# Patient Record
Sex: Female | Born: 1990 | Race: White | Hispanic: No | Marital: Married | Smoking: Never smoker
Health system: Southern US, Community
[De-identification: ages and names within clinical notes are randomized; demographics above are authoritative.]

## PROBLEM LIST (undated history)

## (undated) DIAGNOSIS — Z789 Other specified health status: Secondary | ICD-10-CM

## (undated) HISTORY — PX: NO PAST SURGERIES: SHX2092

---

## 2008-01-10 DIAGNOSIS — N915 Oligomenorrhea, unspecified: Secondary | ICD-10-CM | POA: Insufficient documentation
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
## 2016-09-28 ENCOUNTER — Encounter: Payer: Self-pay | Admitting: Family Medicine

## 2016-09-28 ENCOUNTER — Ambulatory Visit (INDEPENDENT_AMBULATORY_CARE_PROVIDER_SITE_OTHER): Payer: BC Managed Care – PPO | Admitting: Family Medicine

## 2016-09-28 ENCOUNTER — Ambulatory Visit: Payer: Self-pay

## 2016-09-28 VITALS — BP 108/78 | HR 62 | Ht 65.0 in | Wt 126.0 lb

## 2016-09-28 DIAGNOSIS — M25562 Pain in left knee: Secondary | ICD-10-CM

## 2016-09-28 DIAGNOSIS — G8929 Other chronic pain: Secondary | ICD-10-CM | POA: Insufficient documentation

## 2016-09-28 DIAGNOSIS — M25569 Pain in unspecified knee: Secondary | ICD-10-CM

## 2016-09-28 MED ORDER — DICLOFENAC SODIUM 2 % TD SOLN: 2.0000 "application " | Freq: Two times a day (BID) | TRANSDERMAL | 3 refills | Status: DC

## 2016-09-28 MED ORDER — VITAMIN D (ERGOCALCIFEROL) 1.25 MG (50000 UNIT) PO CAPS: 50000.0000 [IU] | ORAL_CAPSULE | ORAL | 0 refills | Status: DC

## 2016-09-28 NOTE — Patient Instructions (Addendum)
Good to see you.  Ice 20 minutes 2 times daily. Usually after activity and before bed. Exercises 3 times a week.  Avoid deep squats or twisting motions. Or any jumping.   cross train with biking and elliptical.  pennsaid pinkie amount topically 2 times daily as needed.   Once weekly vitamin D for next 12 weeks to help if there is a small fracture.  See me again in 4 weeks Happy holidays!

## 2016-09-28 NOTE — Assessment & Plan Note (Signed)
I believe the patient is having some patellofemoral pain is likely secondary to more of a syndrome. There is some mild increase in Doppler flow. Patient does state that there is some swelling. Concern for potential OCD. We discussed we could get another x-ray but patient declined. Patient will given home exercises and will try this. We discussed topical anti-inflammatories and once weekly vitamin D. This will help with muscle strength and endurance. Follow-up again in 4 weeks.

## 2016-10-26 ENCOUNTER — Ambulatory Visit: Payer: BC Managed Care – PPO | Admitting: Family Medicine
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
## 2017-02-06 ENCOUNTER — Ambulatory Visit (INDEPENDENT_AMBULATORY_CARE_PROVIDER_SITE_OTHER)
Admission: RE | Admit: 2017-02-06 | Discharge: 2017-02-06 | Disposition: A | Discharge status: Home / Self Care | Payer: BC Managed Care – PPO | Source: Ambulatory Visit | Attending: Family Medicine | Admitting: Family Medicine

## 2017-02-06 ENCOUNTER — Encounter: Payer: Self-pay | Admitting: Family Medicine

## 2017-02-06 ENCOUNTER — Ambulatory Visit: Payer: BC Managed Care – PPO | Admitting: Family Medicine

## 2017-02-06 ENCOUNTER — Ambulatory Visit (INDEPENDENT_AMBULATORY_CARE_PROVIDER_SITE_OTHER): Payer: BC Managed Care – PPO | Admitting: Family Medicine

## 2017-02-06 DIAGNOSIS — G8929 Other chronic pain: Secondary | ICD-10-CM

## 2017-02-06 DIAGNOSIS — M25562 Pain in left knee: Secondary | ICD-10-CM

## 2017-02-06 NOTE — Progress Notes (Signed)
Pre-visit discussion using our clinic review tool. No additional management support is needed unless otherwise documented below in the visit note.  

## 2017-02-06 NOTE — Patient Instructions (Signed)
Godo to see you Continue the exercises pennsaid pinkie amount topically 2 times daily as needed.  Try the brace with working out and a lot of activity  PT will be calling you   Xray downstairs  See em again in 3-6 weeks and if not better we will consider MRI.

## 2017-02-06 NOTE — Assessment & Plan Note (Signed)
I believe the patient does have more of a chronic subluxation of the kneecap. X-rays ordered today, sent to formal physical therapy, continue topical anti-inflammatories, we discussed icing regimen. Patient will continue to be active. Follow-up again in 4 weeks. Brace given today.

## 2017-02-07 ENCOUNTER — Ambulatory Visit: Payer: BC Managed Care – PPO | Attending: Family Medicine

## 2017-02-07 DIAGNOSIS — M25562 Pain in left knee: Secondary | ICD-10-CM | POA: Diagnosis present

## 2017-02-07 DIAGNOSIS — M228X2 Other disorders of patella, left knee: Secondary | ICD-10-CM

## 2017-02-07 DIAGNOSIS — G8929 Other chronic pain: Secondary | ICD-10-CM

## 2017-02-07 DIAGNOSIS — M6281 Muscle weakness (generalized): Secondary | ICD-10-CM
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
## 2017-02-07 NOTE — Progress Notes (Signed)
Spoke with patient letting her know that her x-ray is normal.

## 2017-03-08 ENCOUNTER — Ambulatory Visit (INDEPENDENT_AMBULATORY_CARE_PROVIDER_SITE_OTHER): Payer: BC Managed Care – PPO | Admitting: Family Medicine

## 2017-03-08 ENCOUNTER — Encounter: Payer: Self-pay | Admitting: Family Medicine

## 2017-03-08 DIAGNOSIS — G8929 Other chronic pain: Secondary | ICD-10-CM | POA: Diagnosis not present

## 2017-03-08 DIAGNOSIS — M25562 Pain in left knee: Secondary | ICD-10-CM | POA: Diagnosis not present

## 2017-03-08 NOTE — Assessment & Plan Note (Signed)
Patient given injection of left knee today due to no improvement and if anything worsening symptoms. Hopefully that this will be beneficial. Patient is failed all other conservative therapy. Continued have pain we will get an MRI for further evaluation make sure patient does not have a buckle handle tear or potential OCD. Patient has very minimal swelling today so I do not think these are very likely. Patient will come back and see me otherwise in 6 weeks.

## 2017-03-08 NOTE — Patient Instructions (Signed)
Good to see you  Ice is your friend Try the brace with a lot of activity  Send me a message in 2 weeks and tell me how you are doing.  See me again otherwise in 4-6 weeks.
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
## 2017-03-10 ENCOUNTER — Telehealth: Payer: Self-pay | Admitting: General Practice

## 2017-03-10 NOTE — Telephone Encounter (Signed)
Pt husband called about his wifes Vitamin D, Ergocalciferol, (DRISDOL) 50000 units CAPS capsule  Would like to know if a refill is appropriate   [facility] on [address]

## 2017-03-13 ENCOUNTER — Other Ambulatory Visit: Payer: Self-pay

## 2017-03-13 MED ORDER — VITAMIN D (ERGOCALCIFEROL) 1.25 MG (50000 UNIT) PO CAPS: 50000.0000 [IU] | ORAL_CAPSULE | ORAL | 0 refills | Status: DC

## 2017-03-13 NOTE — Telephone Encounter (Signed)
Dr. Katrinka BlazingSmith recommends that she try Vitamin D again. Rx sent in per his verbal recommendation.

## 2017-03-22 ENCOUNTER — Encounter: Payer: Self-pay | Admitting: Family Medicine

## 2017-03-22 DIAGNOSIS — M25562 Pain in left knee: Principal | ICD-10-CM

## 2017-03-22 DIAGNOSIS — G8929 Other chronic pain: Secondary | ICD-10-CM

## 2017-04-05 ENCOUNTER — Ambulatory Visit
Admission: RE | Admit: 2017-04-05 | Discharge: 2017-04-05 | Disposition: A | Discharge status: Home / Self Care | Payer: BC Managed Care – PPO | Source: Ambulatory Visit | Attending: Family Medicine | Admitting: Family Medicine

## 2017-04-05 DIAGNOSIS — M25562 Pain in left knee: Principal | ICD-10-CM

## 2017-04-05 DIAGNOSIS — G8929 Other chronic pain: Secondary | ICD-10-CM

## 2017-04-06 ENCOUNTER — Encounter: Payer: Self-pay | Admitting: Family Medicine

## 2017-06-03 ENCOUNTER — Other Ambulatory Visit: Payer: Self-pay | Admitting: Family Medicine

## 2017-06-05 NOTE — Telephone Encounter (Signed)
Refill done.  

## 2017-07-14 LAB — OB RESULTS CONSOLE GC/CHLAMYDIA
Chlamydia: NEGATIVE
Gonorrhea: NEGATIVE

## 2017-07-14 LAB — OB RESULTS CONSOLE RUBELLA ANTIBODY, IGM: Rubella: IMMUNE

## 2017-07-14 LAB — OB RESULTS CONSOLE HIV ANTIBODY (ROUTINE TESTING): HIV: NONREACTIVE

## 2017-07-14 LAB — OB RESULTS CONSOLE RPR: RPR: NONREACTIVE

## 2017-07-14 LAB — OB RESULTS CONSOLE HEPATITIS B SURFACE ANTIGEN: HEP B S AG: NEGATIVE
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
## 2018-01-12 LAB — OB RESULTS CONSOLE GBS: GBS: NEGATIVE

## 2018-02-07 ENCOUNTER — Inpatient Hospital Stay (HOSPITAL_COMMUNITY): Payer: BC Managed Care – PPO | Admitting: Anesthesiology

## 2018-02-07 ENCOUNTER — Encounter (HOSPITAL_COMMUNITY): Payer: Self-pay

## 2018-02-07 ENCOUNTER — Inpatient Hospital Stay (HOSPITAL_COMMUNITY)
Admission: AD | Admit: 2018-02-07 | Discharge: 2018-02-11 | DRG: 787 | Disposition: A | Discharge status: Home / Self Care | Payer: BC Managed Care – PPO | Source: Ambulatory Visit | Attending: Obstetrics and Gynecology | Admitting: Obstetrics and Gynecology

## 2018-02-07 ENCOUNTER — Encounter (HOSPITAL_COMMUNITY)
Admission: AD | Disposition: A | Discharge status: Home / Self Care | Payer: Self-pay | Source: Ambulatory Visit | Attending: Obstetrics and Gynecology

## 2018-02-07 DIAGNOSIS — O9081 Anemia of the puerperium: Secondary | ICD-10-CM | POA: Diagnosis not present

## 2018-02-07 DIAGNOSIS — Z3A40 40 weeks gestation of pregnancy: Secondary | ICD-10-CM

## 2018-02-07 DIAGNOSIS — D62 Acute posthemorrhagic anemia: Secondary | ICD-10-CM | POA: Diagnosis not present

## 2018-02-07 DIAGNOSIS — Z3483 Encounter for supervision of other normal pregnancy, third trimester: Secondary | ICD-10-CM | POA: Diagnosis present

## 2018-02-07 DIAGNOSIS — O3663X0 Maternal care for excessive fetal growth, third trimester, not applicable or unspecified: Principal | ICD-10-CM | POA: Diagnosis present

## 2018-02-07 HISTORY — DX: Other specified health status: Z78.9

## 2018-02-07 LAB — CBC
HCT: 33.2 % — ABNORMAL LOW (ref 36.0–46.0)
HEMOGLOBIN: 11.2 g/dL — AB (ref 12.0–15.0)
MCH: 29.7 pg (ref 26.0–34.0)
MCHC: 33.7 g/dL (ref 30.0–36.0)
MCV: 88.1 fL (ref 78.0–100.0)
Platelets: 152 10*3/uL (ref 150–400)
RBC: 3.77 MIL/uL — AB (ref 3.87–5.11)
RDW: 13.5 % (ref 11.5–15.5)
WBC: 15 10*3/uL — AB (ref 4.0–10.5)

## 2018-02-07 LAB — TYPE AND SCREEN
ABO/RH(D): O POS
ANTIBODY SCREEN: NEGATIVE

## 2018-02-07 LAB — ABO/RH: ABO/RH(D): O POS

## 2018-02-07 SURGERY — Surgical Case
Anesthesia: Regional

## 2018-02-07 MED ORDER — PHENYLEPHRINE 40 MCG/ML (10ML) SYRINGE FOR IV PUSH (FOR BLOOD PRESSURE SUPPORT)
80.0000 ug | PREFILLED_SYRINGE | INTRAVENOUS | Status: DC | PRN
  Administered 2018-02-07: 80 ug via INTRAVENOUS

## 2018-02-07 MED ORDER — FENTANYL 2.5 MCG/ML BUPIVACAINE 1/10 % EPIDURAL INFUSION (WH - ANES)
14.0000 mL/h | INTRAMUSCULAR | Status: DC | PRN
  Administered 2018-02-07 (×2): 14 mL/h via EPIDURAL
  Filled 2018-02-07 (×2): qty 100

## 2018-02-07 MED ORDER — LIDOCAINE HCL (PF) 1 % IJ SOLN
INTRAMUSCULAR | Status: DC | PRN
  Administered 2018-02-07: 8 mL via EPIDURAL

## 2018-02-07 MED ORDER — EPHEDRINE 5 MG/ML INJ: 10.0000 mg | INTRAVENOUS | Status: DC | PRN

## 2018-02-07 MED ORDER — OXYTOCIN 40 UNITS IN LACTATED RINGERS INFUSION - SIMPLE MED
2.5000 [IU]/h | INTRAVENOUS | Status: DC
  Filled 2018-02-07: qty 1000

## 2018-02-07 MED ORDER — OXYTOCIN BOLUS FROM INFUSION: 500.0000 mL | Freq: Once | INTRAVENOUS | Status: DC

## 2018-02-07 MED ORDER — LACTATED RINGERS IV SOLN
INTRAVENOUS | Status: DC
  Administered 2018-02-07 – 2018-02-08 (×4): via INTRAVENOUS

## 2018-02-07 MED ORDER — LACTATED RINGERS IV SOLN
500.0000 mL | INTRAVENOUS | Status: DC | PRN
  Administered 2018-02-07 (×2): 500 mL via INTRAVENOUS

## 2018-02-07 MED ORDER — NALBUPHINE HCL 10 MG/ML IJ SOLN
10.0000 mg | INTRAMUSCULAR | Status: AC
  Administered 2018-02-07: 10 mg via SUBCUTANEOUS
  Filled 2018-02-07: qty 1

## 2018-02-07 MED ORDER — ONDANSETRON HCL 4 MG/2ML IJ SOLN
4.0000 mg | Freq: Four times a day (QID) | INTRAMUSCULAR | Status: DC | PRN
  Administered 2018-02-07 (×2): 4 mg via INTRAVENOUS
  Filled 2018-02-07 (×2): qty 2

## 2018-02-07 MED ORDER — OXYTOCIN 40 UNITS IN LACTATED RINGERS INFUSION - SIMPLE MED
1.0000 m[IU]/min | INTRAVENOUS | Status: DC
  Administered 2018-02-07: 2 m[IU]/min via INTRAVENOUS

## 2018-02-07 MED ORDER — TERBUTALINE SULFATE 1 MG/ML IJ SOLN: 0.2500 mg | Freq: Once | INTRAMUSCULAR | Status: DC | PRN

## 2018-02-07 MED ORDER — DIPHENHYDRAMINE HCL 50 MG/ML IJ SOLN
12.5000 mg | INTRAMUSCULAR | Status: DC | PRN
Start: 2018-02-07 — End: 2018-02-08

## 2018-02-07 MED ORDER — PHENYLEPHRINE 40 MCG/ML (10ML) SYRINGE FOR IV PUSH (FOR BLOOD PRESSURE SUPPORT)
80.0000 ug | PREFILLED_SYRINGE | INTRAVENOUS | Status: DC | PRN
  Filled 2018-02-07: qty 10

## 2018-02-07 MED ORDER — LIDOCAINE HCL (PF) 1 % IJ SOLN
30.0000 mL | INTRAMUSCULAR | Status: DC | PRN
  Filled 2018-02-07: qty 30

## 2018-02-07 MED ORDER — LACTATED RINGERS IV SOLN
500.0000 mL | Freq: Once | INTRAVENOUS | Status: AC
  Administered 2018-02-07: 500 mL via INTRAVENOUS

## 2018-02-07 MED ORDER — SOD CITRATE-CITRIC ACID 500-334 MG/5ML PO SOLN
30.0000 mL | ORAL | Status: DC | PRN
  Administered 2018-02-07: 30 mL via ORAL
  Filled 2018-02-07: qty 15

## 2018-02-07 MED ORDER — ACETAMINOPHEN 325 MG PO TABS: 650.0000 mg | ORAL_TABLET | ORAL | Status: DC | PRN

## 2018-02-07 NOTE — MAU Note (Signed)
CTX 5 mins apart.  No LOF.  Some light spotting tonight.  Reports good FM.  No complications with the pregnancy.

## 2018-02-07 NOTE — Anesthesia Preprocedure Evaluation (Signed)

## 2018-02-07 NOTE — Progress Notes (Signed)
S:  Pt. Breathing well through contractions; has been in the shower/using birth ball and exaggerated sims position with peanut. Asked for membrane sweep to help progress contractions.   O:  VS: Blood pressure (!) 98/45, pulse 70, temperature 98.1 F (36.7 C), resp. rate 20, height 5\' 6"  (1.676 m), weight 75 kg (165 lb 4 oz).        FHR : baseline 145bpm / variability moderate / accelerations + / no decelerations        Toco: contractions every 3-6 minutes / moderate        Cervix : Dilation: 4 Effacement (%): 90 Station: 0 Presentation: Vertex Exam by:: M.Mikael Skoda, CNM  Membrane sweep performed        Membranes: Intact  A: Latent labor     FHR category 1 with intermittent monitoring     GBS Negative     Suspect LGA  P: Pt. Desires low intervention birth; desires to rest.  Encouraged exaggerated sims with peanut. Continuous labor support from spouse, mom, and CNM.     Carlean JewsMeredith Keturah Yerby, MSN, CNM [facility]

## 2018-02-07 NOTE — Progress Notes (Signed)
S:  Resting in bed with peanut after shower       Breathing with ctx - states difficulty relaxing with ctx  O:  VS: BP (!) 98/45   Pulse 70   Temp 98.1 F (36.7 C)   Resp 20   Ht 5\' 6"  (1.676 m)   Wt 75 kg (165 lb 4 oz)   BMI 26.67 kg/m         FHR : 145 intermittent        Toco: contractions every 3 minutes        Cervix : last exam by Carlean JewsMeredith Sigmon CNM ~ 30 minutes ago / cervix same / membranes swept        Membranes: intact / + show  A: latent labor     LGA     desires limited intervention  P: offered Nubain dose to help relaxation without sedation - agrees      expectant management    Marlinda Mikeanya Kamee Bobst CNM, MSN, [facility] 02/07/2018, 9:12 AM

## 2018-02-07 NOTE — Progress Notes (Signed)
S:  Reports contractions as more painful.  Unable to relax with contractions.  Discussed risks and benefits of nitrous oxide and  Epidural.  Desires epidural and AROM.   Family is present and supportive.    O:   Vitals: BP (!) 96/56   Pulse 63   Temp 98.3 F (36.8 C) (Oral)   Resp 20   Ht 5\' 6"  (1.676 m)   Wt 75 kg (165 lb 4 oz)   BMI 26.67 kg/m  Contractions every 3-5 minutes FHR 120 bpm, moderate variability, 15x15 accels, no decels Bloody show, AROM, blood tinged. SVE Dilation: 4.5 Effacement (%): 90 Exam by:: S.Briell Paulette, midwife student   A: Protracted Latent labor Suspect LGA   P: AROM Epidural Continuous monitoring Provide emotional support. Anticipate NSVD.    Ceasar MonsSarah Averill Pons, RN, SNM 02/07/2018 , 12:49 PM

## 2018-02-07 NOTE — Progress Notes (Signed)
S:  Patient comfortable with epidural.  Denies feeling vaginal or rectal pressure.  Lying left lateral with peanut ball.  Has been able to get some rest.  Family is present and supportive.  O:   Vitals: BP (!) 96/57   Pulse (!) 58   Temp 98 F (36.7 C) (Oral)   Resp 18   Ht 5\' 6"  (1.676 m)   Wt 75 kg (165 lb 4 oz)   SpO2 94%   BMI 26.67 kg/m   Urine dark yellow, concentrated Contractions every 5-6 minutes, lasting 120-140 seconds. FHR 120 bpm, moderate variability, 15x15 accels , no decels Bloody show, AROM. SVE Dilation: 7 Effacement (%): 100 Exam by:: Young BerryS Omarrion Carmer midwife student  Fetal Caput present  A: Active labor Category 1 FHR Suspect LGA Concentrated urine  P: Continue with current plan of care, expectant management. 500 ml bolus, encourage to hydrate Continuous Monitoring Provide emotional support. Anticipate NSVD.   Ceasar MonsSarah Rolonda Pontarelli, RN, SNM 02/07/2018 , 4:16 PM

## 2018-02-07 NOTE — Progress Notes (Signed)
S:  pushed x 1.5 hour - no pain or pressure  O:  VS: BP (!) 113/56   Pulse 66   Temp 98.5 F (36.9 C) (Oral)   Resp 16   Ht 5\' 6"  (1.676 m)   Wt 75 kg (165 lb 4 oz)   SpO2 94%   BMI 26.67 kg/m         FHR : baseline 145 / variability moderate / accelerations absent / variable decelerations        Toco: contractions every 2-6 minutes / moderate         vtx +1 caput - no descent  A: arrest of descent in second stage of labor     FHR category 2  P: consult for CS delivery   Discussed arrest of descent - needs for operative delivery  / risk of surgery include infection, excess bleeding, need for transfusion and risks associated with transfusion, unintentional damage to local organs specifically bladder and bowel, extended recovery time post-op,  Additionally,  risk to not having surgery is inability to achieve vaginal delivery and fetal stress with continued pushing efforts without adequate pelvic space  Agrees to surgery option at this time - Dr Billy Coastaavon en route for c-section delivery     Marlinda Mikeanya Bailey CNM, MSN, [facility]NM 02/07/2018, 11:54 PM

## 2018-02-07 NOTE — Anesthesia Procedure Notes (Signed)
Epidural Patient location during procedure: OB Start time: 02/07/2018 1:40 PM End time: 02/07/2018 1:45 PM  Staffing Anesthesiologist: Bethena Midgetddono, Aurorah Schlachter, MD  Preanesthetic Checklist Completed: patient identified, site marked, surgical consent, pre-op evaluation, timeout performed, IV checked, risks and benefits discussed and monitors and equipment checked  Epidural Patient position: sitting Prep: site prepped and draped and DuraPrep Patient monitoring: continuous pulse ox and blood pressure Approach: midline Injection technique: LOR air  Needle:  Needle type: Tuohy  Needle gauge: 17 G Needle length: 9 cm and 9 Needle insertion depth: 6 cm Catheter type: closed end flexible Catheter size: 19 Gauge Catheter at skin depth: 11 cm Test dose: negative  Assessment Events: blood not aspirated, injection not painful, no injection resistance, negative IV test and no paresthesia

## 2018-02-07 NOTE — Plan of Care (Signed)
Pt desires low intervention unmedicated labor and delivery.  Options for nonpharmacological pain control such as position changes, shower, walking, and birthing ball explained to pt.  Pt has no IV per her request and CNM is aware; explained to pt that IV would need to be obtained in the case of an emergency.  Currently being monitored intermittently per CNM order. Will continue to monitor and assist pt as needed, will communicate pertinent information to team.

## 2018-02-07 NOTE — Progress Notes (Signed)
S:  no pressure or urge to push - dense epidural  O:  VS: BP 122/74   Pulse 73   Temp 98.2 F (36.8 C) (Oral)   Resp 16   Ht 5\' 6"  (1.676 m)   Wt 75 kg (165 lb 4 oz)   SpO2 94%   BMI 26.67 kg/m         FHR : baseline 130 / variability moderate / accelerations + / variable decelerations        Toco: contractions every 2-6 minutes / moderate         Complete at 1830 @ 0 station with history of protracted labor / ROT        Cervix : 10cm / 100% / vtx +1 with small caput        Membranes: clear with small show        Foley with clear urine  A: active labor     FHR category 2  P: pitocin augmentation to improve strength and frequency of ctx - recheck 1 hour      If no progressive descent consult MD  Marlinda Mikeanya Leopold Smyers CNM, MSN, [facility] 02/07/2018, 9:19 PM

## 2018-02-07 NOTE — H&P (Addendum)
  OB ADMISSION/ HISTORY & PHYSICAL:  Admission Date: 02/07/2018  5:36 AM  Admit Diagnosis: Labor at 40 weeks   Robin Mcneil is a 27 y.o. female G1P0 at 40 weeks presenting for labor.  She reports contractions started around 6:00pm yesterday, now stronger and closer together.   Prenatal History: G1P0   EDC : 02/07/2018, by Other B[facility]  [facility]  Primary Ob Provider: Marlinda Mikeanya Bailey  Prenatal course complicated by  - Isolated EIF on anatomy sono, no further f/u  - Mild Maternal Anemia  - LGA at 32 weeks - Excessive weight gain of 42 pounds   Prenatal Labs: ABO, Rh:  O Positive  Antibody:  Negative Rubella:   Immune RPR:   NR HBsAg:   Negative HIV:   Negative  GTT: 100 GBS:   GBS Negative Quad screen Negative CUS: normal anatomy, female, anterior placenta, isolated EIF 32 week sono for size/dates discrepancy: LGA @ 95% Flu and Tdap UTD  Medical / Surgical History :  Past medical history: History reviewed. No pertinent past medical history.   Past surgical history: History reviewed. No pertinent surgical history.  Family History: No family history on file.   Social History:  reports that she has never smoked. She has never used smokeless tobacco. Her alcohol and drug histories are not on file.   Allergies: Patient has no known allergies.    Current Medications at time of admission:  Prior to Admission medications   Medication Sig Start Date End Date Taking? Authorizing Provider  Diclofenac Sodium (PENNSAID) 2 % SOLN Place 2 application onto the skin 2 (two) times daily. 09/28/16   Judi SaaSmith, Zachary M, DO  Vitamin D, Ergocalciferol, (DRISDOL) 50000 units CAPS capsule TAKE 1 CAPSULE BY MOUTH ONCE EVERY 7 DAYS 06/05/17   Judi SaaSmith, Zachary M, DO     Review of Systems: Active FM onset of ctx @ 6pm currently every 5-6 minutes No LOF  / SROM  bloody show present   Physical Exam:  VS: Blood pressure 120/67, pulse 83, temperature 98.1 F  (36.7 C), resp. rate 20, height 5\' 6"  (1.676 m), weight 75 kg (165 lb 4 oz).  General: alert and oriented, grimacing through contractions  Heart: RRR Lungs: Clear lung fields Abdomen: Gravid, soft and non-tender, non-distended / uterus: gravid, non-tender Extremities: no edema  Genitalia / VE: Dilation: 4 Effacement (%): 90 Station: -1, 0 Exam by:: Delta Air LinesMeredith Sigmon, CNM  FHR: baseline rate 135 bpm/ variability moderate / accelerations +15x15 / no decelerations TOCO: every 5-6 minutes  Assessment: [redacted] weeks gestation First stage of labor, latent FHR category 1 GBS Negative  Suspect LGA  Plan:  [facility]   - Routine labor and delivery orders   - Pt. Declines IV at this time   - Pain management: none at this time; open to nitrous    - Encouraged frequent position changes, birth ball, shower  2. GBS Negative   - No prophylaxis indicated 3. Postpartum:   - Breast   - Circumcision: undecided  4. Anticipate MOD: NSVD   - EFW by Leopold's 8#   Dr. Ernestina PennaFogleman notified of admission / plan of care  Carlean JewsMeredith Sigmon, MSN, [facility]

## 2018-02-08 ENCOUNTER — Other Ambulatory Visit: Payer: Self-pay

## 2018-02-08 ENCOUNTER — Encounter (HOSPITAL_COMMUNITY): Payer: Self-pay

## 2018-02-08 ENCOUNTER — Encounter (HOSPITAL_COMMUNITY)
Admission: AD | Disposition: A | Discharge status: Home / Self Care | Payer: Self-pay | Source: Ambulatory Visit | Attending: Obstetrics and Gynecology

## 2018-02-08 DIAGNOSIS — D62 Acute posthemorrhagic anemia: Secondary | ICD-10-CM | POA: Diagnosis not present

## 2018-02-08 LAB — CBC
HCT: 23.9 % — ABNORMAL LOW (ref 36.0–46.0)
HEMOGLOBIN: 8.3 g/dL — AB (ref 12.0–15.0)
MCH: 30.5 pg (ref 26.0–34.0)
MCHC: 34.7 g/dL (ref 30.0–36.0)
MCV: 87.9 fL (ref 78.0–100.0)
Platelets: 128 10*3/uL — ABNORMAL LOW (ref 150–400)
RBC: 2.72 MIL/uL — AB (ref 3.87–5.11)
RDW: 13.6 % (ref 11.5–15.5)
WBC: 17.2 10*3/uL — AB (ref 4.0–10.5)

## 2018-02-08 LAB — SYPHILIS: RPR W/REFLEX TO RPR TITER AND TREPONEMAL ANTIBODIES, TRADITIONAL SCREENING AND DIAGNOSIS ALGORITHM: RPR Ser Ql: NONREACTIVE

## 2018-02-08 SURGERY — Surgical Case
Anesthesia: Epidural | Wound class: Clean Contaminated

## 2018-02-08 MED ORDER — OXYTOCIN 40 UNITS IN LACTATED RINGERS INFUSION - SIMPLE MED: 2.5000 [IU]/h | INTRAVENOUS | Status: DC

## 2018-02-08 MED ORDER — ONDANSETRON HCL 4 MG/2ML IJ SOLN
INTRAMUSCULAR | Status: AC
  Filled 2018-02-08: qty 2

## 2018-02-08 MED ORDER — OXYCODONE-ACETAMINOPHEN 5-325 MG PO TABS: 2.0000 | ORAL_TABLET | ORAL | Status: DC | PRN

## 2018-02-08 MED ORDER — MAGNESIUM OXIDE 400 (241.3 MG) MG PO TABS
400.0000 mg | ORAL_TABLET | Freq: Every day | ORAL | Status: DC
  Administered 2018-02-09 – 2018-02-11 (×3): 400 mg via ORAL
  Filled 2018-02-08 (×5): qty 1

## 2018-02-08 MED ORDER — SCOPOLAMINE 1 MG/3DAYS TD PT72
MEDICATED_PATCH | TRANSDERMAL | Status: AC
  Filled 2018-02-08: qty 1

## 2018-02-08 MED ORDER — CEFAZOLIN SODIUM-DEXTROSE 2-4 GM/100ML-% IV SOLN: 2.0000 g | INTRAVENOUS | Status: DC

## 2018-02-08 MED ORDER — NALOXONE HCL 0.4 MG/ML IJ SOLN: 0.4000 mg | INTRAMUSCULAR | Status: DC | PRN

## 2018-02-08 MED ORDER — ACETAMINOPHEN 325 MG PO TABS
650.0000 mg | ORAL_TABLET | ORAL | Status: DC | PRN
  Administered 2018-02-09 – 2018-02-11 (×9): 650 mg via ORAL
  Filled 2018-02-08 (×8): qty 2

## 2018-02-08 MED ORDER — MORPHINE SULFATE (PF) 0.5 MG/ML IJ SOLN
INTRAMUSCULAR | Status: DC | PRN
  Administered 2018-02-08: 3 mg via EPIDURAL

## 2018-02-08 MED ORDER — ACETAMINOPHEN 500 MG PO TABS
1000.0000 mg | ORAL_TABLET | Freq: Four times a day (QID) | ORAL | Status: DC
  Administered 2018-02-08 (×2): 1000 mg via ORAL
  Filled 2018-02-08 (×2): qty 2

## 2018-02-08 MED ORDER — ONDANSETRON HCL 4 MG/2ML IJ SOLN
INTRAMUSCULAR | Status: DC | PRN
  Administered 2018-02-08: 4 mg via INTRAVENOUS

## 2018-02-08 MED ORDER — FENTANYL CITRATE (PF) 100 MCG/2ML IJ SOLN: 25.0000 ug | INTRAMUSCULAR | Status: DC | PRN

## 2018-02-08 MED ORDER — METHYLERGONOVINE MALEATE 0.2 MG/ML IJ SOLN: 0.2000 mg | INTRAMUSCULAR | Status: DC | PRN

## 2018-02-08 MED ORDER — NALBUPHINE HCL 10 MG/ML IJ SOLN
5.0000 mg | Freq: Once | INTRAMUSCULAR | Status: AC | PRN
  Administered 2018-02-08: 5 mg via SUBCUTANEOUS
  Filled 2018-02-08: qty 1

## 2018-02-08 MED ORDER — MORPHINE SULFATE (PF) 0.5 MG/ML IJ SOLN
INTRAMUSCULAR | Status: AC
  Filled 2018-02-08: qty 10

## 2018-02-08 MED ORDER — WITCH HAZEL-GLYCERIN EX PADS
1.0000 "application " | MEDICATED_PAD | CUTANEOUS | Status: DC | PRN
  Administered 2018-02-09: 1 via TOPICAL

## 2018-02-08 MED ORDER — ZOLPIDEM TARTRATE 5 MG PO TABS: 5.0000 mg | ORAL_TABLET | Freq: Every evening | ORAL | Status: DC | PRN

## 2018-02-08 MED ORDER — ONDANSETRON HCL 4 MG/2ML IJ SOLN: 4.0000 mg | Freq: Three times a day (TID) | INTRAMUSCULAR | Status: DC | PRN

## 2018-02-08 MED ORDER — LIDOCAINE-EPINEPHRINE (PF) 2 %-1:200000 IJ SOLN
INTRAMUSCULAR | Status: DC | PRN
  Administered 2018-02-08 (×3): 5 mL via EPIDURAL

## 2018-02-08 MED ORDER — NALOXONE HCL 4 MG/10ML IJ SOLN
1.0000 ug/kg/h | INTRAVENOUS | Status: DC | PRN
  Filled 2018-02-08: qty 5

## 2018-02-08 MED ORDER — IBUPROFEN 600 MG PO TABS
600.0000 mg | ORAL_TABLET | Freq: Four times a day (QID) | ORAL | Status: DC
  Administered 2018-02-08 – 2018-02-11 (×13): 600 mg via ORAL
  Filled 2018-02-08 (×13): qty 1

## 2018-02-08 MED ORDER — NALBUPHINE HCL 10 MG/ML IJ SOLN: 5.0000 mg | INTRAMUSCULAR | Status: DC | PRN

## 2018-02-08 MED ORDER — PRENATAL MULTIVITAMIN CH
1.0000 | ORAL_TABLET | Freq: Every day | ORAL | Status: DC
  Administered 2018-02-09 – 2018-02-11 (×3): 1 via ORAL
  Filled 2018-02-08 (×5): qty 1

## 2018-02-08 MED ORDER — DEXAMETHASONE SODIUM PHOSPHATE 10 MG/ML IJ SOLN
INTRAMUSCULAR | Status: AC
  Filled 2018-02-08: qty 1

## 2018-02-08 MED ORDER — OXYCODONE-ACETAMINOPHEN 5-325 MG PO TABS: 1.0000 | ORAL_TABLET | ORAL | Status: DC | PRN

## 2018-02-08 MED ORDER — SIMETHICONE 80 MG PO CHEW: 80.0000 mg | CHEWABLE_TABLET | ORAL | Status: DC | PRN

## 2018-02-08 MED ORDER — BUPIVACAINE HCL (PF) 0.25 % IJ SOLN
INTRAMUSCULAR | Status: AC
  Filled 2018-02-08: qty 10

## 2018-02-08 MED ORDER — SENNOSIDES-DOCUSATE SODIUM 8.6-50 MG PO TABS
2.0000 | ORAL_TABLET | ORAL | Status: DC
  Administered 2018-02-09 – 2018-02-10 (×3): 2 via ORAL
  Filled 2018-02-08 (×3): qty 2

## 2018-02-08 MED ORDER — DIPHENHYDRAMINE HCL 50 MG/ML IJ SOLN: 12.5000 mg | INTRAMUSCULAR | Status: DC | PRN

## 2018-02-08 MED ORDER — CEFAZOLIN SODIUM-DEXTROSE 2-4 GM/100ML-% IV SOLN
INTRAVENOUS | Status: AC
  Filled 2018-02-08: qty 100

## 2018-02-08 MED ORDER — LACTATED RINGERS IV SOLN
INTRAVENOUS | Status: DC | PRN
  Administered 2018-02-08: 01:00:00 via INTRAVENOUS

## 2018-02-08 MED ORDER — METHYLERGONOVINE MALEATE 0.2 MG PO TABS: 0.2000 mg | ORAL_TABLET | ORAL | Status: DC | PRN

## 2018-02-08 MED ORDER — COCONUT OIL OIL
1.0000 "application " | TOPICAL_OIL | Status: DC | PRN
  Administered 2018-02-09: 1 via TOPICAL
  Filled 2018-02-08: qty 120

## 2018-02-08 MED ORDER — OXYTOCIN 10 UNIT/ML IJ SOLN
INTRAMUSCULAR | Status: AC
  Filled 2018-02-08: qty 4

## 2018-02-08 MED ORDER — BUPIVACAINE HCL (PF) 0.25 % IJ SOLN
INTRAMUSCULAR | Status: DC | PRN
  Administered 2018-02-08: 20 mL

## 2018-02-08 MED ORDER — LACTATED RINGERS IV SOLN: INTRAVENOUS | Status: DC

## 2018-02-08 MED ORDER — DIPHENHYDRAMINE HCL 25 MG PO CAPS: 25.0000 mg | ORAL_CAPSULE | Freq: Four times a day (QID) | ORAL | Status: DC | PRN

## 2018-02-08 MED ORDER — MEPERIDINE HCL 25 MG/ML IJ SOLN
INTRAMUSCULAR | Status: AC
  Filled 2018-02-08: qty 1

## 2018-02-08 MED ORDER — POLYSACCHARIDE IRON COMPLEX 150 MG PO CAPS
150.0000 mg | ORAL_CAPSULE | Freq: Every day | ORAL | Status: DC
  Administered 2018-02-08 – 2018-02-11 (×4): 150 mg via ORAL
  Filled 2018-02-08 (×4): qty 1

## 2018-02-08 MED ORDER — DEXAMETHASONE SODIUM PHOSPHATE 10 MG/ML IJ SOLN
INTRAMUSCULAR | Status: DC | PRN
  Administered 2018-02-08: 10 mg via INTRAVENOUS

## 2018-02-08 MED ORDER — SODIUM CHLORIDE 0.9 % IR SOLN
Status: DC | PRN
  Administered 2018-02-08: 1

## 2018-02-08 MED ORDER — DIPHENHYDRAMINE HCL 25 MG PO CAPS: 25.0000 mg | ORAL_CAPSULE | ORAL | Status: DC | PRN

## 2018-02-08 MED ORDER — DIBUCAINE 1 % RE OINT: 1.0000 "application " | TOPICAL_OINTMENT | RECTAL | Status: DC | PRN

## 2018-02-08 MED ORDER — TETANUS-DIPHTH-ACELL PERTUSSIS 5-2.5-18.5 LF-MCG/0.5 IM SUSP: 0.5000 mL | Freq: Once | INTRAMUSCULAR | Status: DC

## 2018-02-08 MED ORDER — MENTHOL 3 MG MT LOZG: 1.0000 | LOZENGE | OROMUCOSAL | Status: DC | PRN

## 2018-02-08 MED ORDER — SIMETHICONE 80 MG PO CHEW
80.0000 mg | CHEWABLE_TABLET | Freq: Three times a day (TID) | ORAL | Status: DC
  Administered 2018-02-08 – 2018-02-11 (×9): 80 mg via ORAL
  Filled 2018-02-08 (×9): qty 1

## 2018-02-08 MED ORDER — SCOPOLAMINE 1 MG/3DAYS TD PT72
1.0000 | MEDICATED_PATCH | Freq: Once | TRANSDERMAL | Status: DC
  Filled 2018-02-08: qty 1

## 2018-02-08 MED ORDER — KETOROLAC TROMETHAMINE 30 MG/ML IJ SOLN: 30.0000 mg | Freq: Four times a day (QID) | INTRAMUSCULAR | Status: DC | PRN

## 2018-02-08 MED ORDER — MEPERIDINE HCL 25 MG/ML IJ SOLN: 6.2500 mg | INTRAMUSCULAR | Status: DC | PRN

## 2018-02-08 MED ORDER — SCOPOLAMINE 1 MG/3DAYS TD PT72
MEDICATED_PATCH | TRANSDERMAL | Status: DC | PRN
  Administered 2018-02-08: 1 via TRANSDERMAL

## 2018-02-08 MED ORDER — SODIUM CHLORIDE 0.9% FLUSH: 3.0000 mL | INTRAVENOUS | Status: DC | PRN

## 2018-02-08 MED ORDER — NALBUPHINE HCL 10 MG/ML IJ SOLN: 5.0000 mg | Freq: Once | INTRAMUSCULAR | Status: AC | PRN

## 2018-02-08 MED ORDER — KETOROLAC TROMETHAMINE 30 MG/ML IJ SOLN
30.0000 mg | Freq: Four times a day (QID) | INTRAMUSCULAR | Status: DC | PRN
  Administered 2018-02-08: 30 mg via INTRAMUSCULAR

## 2018-02-08 MED ORDER — SIMETHICONE 80 MG PO CHEW
80.0000 mg | CHEWABLE_TABLET | ORAL | Status: DC
  Administered 2018-02-09 – 2018-02-10 (×3): 80 mg via ORAL
  Filled 2018-02-08 (×3): qty 1

## 2018-02-08 MED ORDER — MEPERIDINE HCL 25 MG/ML IJ SOLN
INTRAMUSCULAR | Status: DC | PRN
  Administered 2018-02-08 (×2): 12.5 mg via INTRAVENOUS

## 2018-02-08 MED ORDER — KETOROLAC TROMETHAMINE 30 MG/ML IJ SOLN
INTRAMUSCULAR | Status: AC
  Filled 2018-02-08: qty 1

## 2018-02-08 MED ORDER — METOCLOPRAMIDE HCL 5 MG/ML IJ SOLN: 10.0000 mg | Freq: Once | INTRAMUSCULAR | Status: DC | PRN

## 2018-02-08 MED ORDER — CEFAZOLIN SODIUM-DEXTROSE 2-3 GM-%(50ML) IV SOLR
INTRAVENOUS | Status: DC | PRN
  Administered 2018-02-08: 2 g via INTRAVENOUS

## 2018-02-08 MED ORDER — OXYTOCIN 10 UNIT/ML IJ SOLN
INTRAVENOUS | Status: DC | PRN
  Administered 2018-02-08: 40 [IU] via INTRAVENOUS

## 2018-02-08 SURGICAL SUPPLY — 34 items
CHLORAPREP W/TINT 26ML (MISCELLANEOUS) ×3 IMPLANT
CLAMP CORD UMBIL (MISCELLANEOUS) IMPLANT
CLOTH BEACON ORANGE TIMEOUT ST (SAFETY) ×3 IMPLANT
DERMABOND ADHESIVE PROPEN (GAUZE/BANDAGES/DRESSINGS) ×2
DERMABOND ADVANCED .7 DNX6 (GAUZE/BANDAGES/DRESSINGS) ×1 IMPLANT
DRSG OPSITE POSTOP 4X10 (GAUZE/BANDAGES/DRESSINGS) ×3 IMPLANT
ELECT REM PT RETURN 9FT ADLT (ELECTROSURGICAL) ×3
ELECTRODE REM PT RTRN 9FT ADLT (ELECTROSURGICAL) ×1 IMPLANT
EXTRACTOR VACUUM M CUP 4 TUBE (SUCTIONS) IMPLANT
EXTRACTOR VACUUM M CUP 4' TUBE (SUCTIONS)
GLOVE BIO SURGEON STRL SZ7.5 (GLOVE) ×3 IMPLANT
GLOVE BIOGEL PI IND STRL 7.0 (GLOVE) ×1 IMPLANT
GLOVE BIOGEL PI INDICATOR 7.0 (GLOVE) ×2
GOWN STRL REUS W/TWL LRG LVL3 (GOWN DISPOSABLE) ×6 IMPLANT
KIT ABG SYR 3ML LUER SLIP (SYRINGE) IMPLANT
NEEDLE HYPO 22GX1.5 SAFETY (NEEDLE) ×3 IMPLANT
NEEDLE HYPO 25X5/8 SAFETYGLIDE (NEEDLE) IMPLANT
NEEDLE SPNL 20GX3.5 QUINCKE YW (NEEDLE) IMPLANT
NS IRRIG 1000ML POUR BTL (IV SOLUTION) ×3 IMPLANT
PACK C SECTION WH (CUSTOM PROCEDURE TRAY) ×3 IMPLANT
PENCIL SMOKE EVAC W/HOLSTER (ELECTROSURGICAL) ×3 IMPLANT
SUT MNCRL 0 VIOLET CTX 36 (SUTURE) ×4 IMPLANT
SUT MNCRL AB 3-0 PS2 27 (SUTURE) IMPLANT
SUT MON AB 2-0 CT1 27 (SUTURE) ×3 IMPLANT
SUT MON AB-0 CT1 36 (SUTURE) ×6 IMPLANT
SUT MONOCRYL 0 CTX 36 (SUTURE) ×8
SUT PLAIN 0 NONE (SUTURE) IMPLANT
SUT PLAIN 2 0 (SUTURE)
SUT PLAIN 2 0 XLH (SUTURE) IMPLANT
SUT PLAIN ABS 2-0 CT1 27XMFL (SUTURE) IMPLANT
SYR 20CC LL (SYRINGE) IMPLANT
SYR CONTROL 10ML LL (SYRINGE) ×3 IMPLANT
TOWEL OR 17X24 6PK STRL BLUE (TOWEL DISPOSABLE) ×3 IMPLANT
TRAY FOLEY BAG SILVER LF 14FR (SET/KITS/TRAYS/PACK) ×3 IMPLANT

## 2018-02-08 NOTE — Op Note (Signed)
Cesarean Section Procedure Note  Indications: failure to progress: arrest of descent  Pre-operative Diagnosis: 40 week 1 day pregnancy.  Post-operative Diagnosis: same  Surgeon: Lenoard AdenAAVON,Tykiera Raven J   Assistants: Montez HagemanBailey, FACM, CNM  Anesthesia: Epidural anesthesia and Local anesthesia 0.25.% bupivacaine  ASA Class: 2  Procedure Details  The patient was seen in the Holding Room. The risks, benefits, complications, treatment options, and expected outcomes were discussed with the patient.  The patient concurred with the proposed plan, giving informed consent. The risks of anesthesia, infection, bleeding and possible injury to other organs discussed. Injury to bowel, bladder, or ureter with possible need for repair discussed. Possible need for transfusion with secondary risks of hepatitis or HIV acquisition discussed. Post operative complications to include but not limited to DVT, PE and Pneumonia noted. The site of surgery properly noted/marked. The patient was taken to Operating Room # 1, identified as Robin Mcneil and the procedure verified as C-Section Delivery. A Time Out was held and the above information confirmed.  After induction of anesthesia, the patient was draped and prepped in the usual sterile manner. A Pfannenstiel incision was made and carried down through the subcutaneous tissue to the fascia. Fascial incision was made and extended transversely using Mayo scissors. The fascia was separated from the underlying rectus tissue superiorly and inferiorly. The peritoneum was identified and entered. Peritoneal incision was extended longitudinally. The utero-vesical peritoneal reflection was incised transversely and the bladder flap was bluntly freed from the lower uterine segment. A low transverse uterine incision(Kerr hysterotomy) was made. Delivered from OP presentation was a  female with Apgar scores of 8 at one minute and 9 at five minutes. Bulb suctioning gently performed. Neonatal team  in attendance.After the umbilical cord was clamped and cut cord blood was obtained for evaluation. The placenta was removed intact and appeared normal. The uterus was curetted with a dry lap pack. Good hemostasis was noted.The uterine outline, tubes and ovaries appeared normal. The uterine incision was closed with running locked sutures of 0 Monocryl x 2 layers.O leary sutures for hemostasis bilateral due to uterine artery extension. Hemostasis was observed. The parietal peritoneum was closed with a running 2-0 Monocryl suture. The fascia was then reapproximated with running sutures of 0 Monocryl. The skin was reapproximated with staples.  Instrument, sponge, and needle counts were correct prior the abdominal closure and at the conclusion of the case.   Findings: As noted  Estimated Blood Loss:  700         Drains: foley                 Specimens: placenta                 Complications:  None; patient tolerated the procedure well.         Disposition: PACU - hemodynamically stable.         Condition: stable  Attending Attestation: I performed the procedure.

## 2018-02-08 NOTE — Lactation Note (Signed)
This note was copied from a baby's chart. Lactation Consultation Note  Patient Name: Robin Mcneil RUEAV'WToday's Date: 02/08/2018 Reason for consult: Initial assessment;1st time breastfeeding;Term   Initial assessment with mom of 10 hour old infant. Infant with 3 BF for 10-20 minutes, 1 void and 2 stools since birth. LATCH scores 5-8. infant weight 8 pounds 6.6 ounces.   Mom reports she feels infant is feeding well. Mom reports some pain with initial latch that improves with feeding or she relatches infant as needed.   Enc mom to feed infant STS 8-12 x in 24 hours at first feeding cues. Mom to use pillow and head support with feeding. Infant is out for his circumcision currently, discussed with mom that infant may be sleepy for 6-8 hours after. Enc mom to get infant STS and to encourage him to  Feed even if sleepy.   Mom reports she has been shown to hand express. Reviewed milk coming to volume and colostrum. Enc mom to allow infant to feed as long as he wishes, offer both breasts with each feeding.   Mom would like assistance with feeding later today. Enc her to call out when infant back and ready to feed.   BF Resources handout and LC Brochure given, mom informed of IP/OP Services, BF Support Groups and LC phone #. Mom has Medela PIS for home use.   Mom reports all questions have been answered at this time.   Maternal Data Formula Feeding for Exclusion: No Has patient been taught Hand Expression?: Yes Does the patient have breastfeeding experience prior to this delivery?: No  Feeding Feeding Type: Breast Fed  LATCH Score Latch: Grasps breast easily, tongue down, lips flanged, rhythmical sucking.  Audible Swallowing: A few with stimulation  Type of Nipple: Everted at rest and after stimulation  Comfort (Breast/Nipple): Soft / non-tender  Hold (Positioning): Assistance needed to correctly position infant at breast and maintain latch.  LATCH Score:  8  Interventions Interventions: Support pillows;Skin to skin;Expressed milk;Breast feeding basics reviewed  Lactation Tools Discussed/Used WIC Program: No   Consult Status Consult Status: Follow-up Date: 02/09/18 Follow-up type: In-patient    Silas FloodSharon S Lavar Rosenzweig 02/08/2018, 11:29 AM

## 2018-02-08 NOTE — Transfer of Care (Signed)
Immediate Anesthesia Transfer of Care Note  Patient: Robin RhineKaitlyn Mcneil  Procedure(s) Performed: CESAREAN SECTION (N/A )  Patient Location: PACU  Anesthesia Type:Epidural  Level of Consciousness: awake, alert  and oriented  Airway & Oxygen Therapy: Patient Spontanous Breathing  Post-op Assessment: Report given to RN and Post -op Vital signs reviewed and stable  Post vital signs: Reviewed and stable BP 101/50, HR 80, RR 16, SaO2 100%  Last Vitals:  Vitals Value Taken Time  BP 101/50 02/08/2018  1:26 AM  Temp 37 C 02/08/2018  1:26 AM  Pulse 85 02/08/2018  1:27 AM  Resp 15 02/08/2018  1:27 AM  SpO2 100 % 02/08/2018  1:27 AM  Vitals shown include unvalidated device data.  Last Pain:  Vitals:   02/08/18 0126  TempSrc: Oral  PainSc: 0-No pain         Complications: No apparent anesthesia complications

## 2018-02-08 NOTE — Anesthesia Postprocedure Evaluation (Signed)
Anesthesia Post Note  Patient: Robin RhineKaitlyn Hodgkins  Procedure(s) Performed: CESAREAN SECTION (N/A )     Patient location during evaluation: Mother Baby Anesthesia Type: Epidural Level of consciousness: awake Pain management: satisfactory to patient Vital Signs Assessment: post-procedure vital signs reviewed and stable Respiratory status: spontaneous breathing Cardiovascular status: stable Anesthetic complications: no    Last Vitals:  Vitals:   02/08/18 0325 02/08/18 0430  BP: 102/63 (!) 102/53  Pulse: 66 62  Resp: 18   Temp: 37.1 C 37.2 C  SpO2: 98% 98%    Last Pain:  Vitals:   02/08/18 0730  TempSrc:   PainSc: 2    Pain Goal:                 KeyCorpBURGER,Natsha Guidry

## 2018-02-08 NOTE — Progress Notes (Signed)
POSTOPERATIVE DAY # 0 S/P CS- arrest of descent   S:         Reports feeling ok - little sore             Tolerating po intake / no nausea / no vomiting / no flatus / no BM             Bleeding is light             Pain controlled with motrin             Up ad lib / ambulatory/ voiding QS  Newborn Breast    O:  VS: BP (!) 100/55   Pulse 68   Temp 98.2 F (36.8 C) (Oral)   Resp 18   Ht 5\' 6"  (1.676 m)   Wt 75 kg (165 lb 4 oz)   SpO2 98%   Breastfeeding? Unknown   BMI 26.67 kg/m    LABS:               Recent Labs    02/07/18 0628 02/08/18 0609  WBC 15.0* 17.2*  HGB 11.2* 8.3*  PLT 152 128*               Bloodtype: --/--/O POS, O POS Performed at [facility], [address] [place], [place] [address]  [phone](04/03 [id number])  Rubella: Immune (09/07 0000)                                             I&O: Intake/Output      04/03 0701 - 04/04 0700 04/04 0701 - 04/05 0700   I.V. (mL/kg) 2200 (29.3)    Total Intake(mL/kg) 2200 (29.3)    Urine (mL/kg/hr) 1400 (0.8) 1750 (2.4)   Blood 1286    Total Output 2686 1750   Net -486 -1750                   Physical Exam:             Alert and Oriented X3  Lungs: Clear and unlabored  Heart: regular rate and rhythm / no mumurs  Abdomen: soft, non-tender, non-distended              Fundus: firm, non-tender, Ueven             Dressing intact               Incision:  approximated with suture / no erythema / no ecchymosis / no drainage  Perineum: intact  Lochia: light  Extremities: no edema, no calf pain or tenderness, SCD in place  A:        POD # 0 S/P primary CS - arrest of descent            ABL anemia - postop  P:        Routine postoperative care              Start iron and magnesium             Repeat CBC in am     Marlinda Mikeanya Nataleigh Griffin CNM, MSN, [facility] 02/08/2018, 4:56 PM

## 2018-02-09 LAB — CBC
HCT: 20.3 % — ABNORMAL LOW (ref 36.0–46.0)
Hemoglobin: 7 g/dL — ABNORMAL LOW (ref 12.0–15.0)
MCH: 30.7 pg (ref 26.0–34.0)
MCHC: 34.5 g/dL (ref 30.0–36.0)
MCV: 89 fL (ref 78.0–100.0)
Platelets: 110 10*3/uL — ABNORMAL LOW (ref 150–400)
RBC: 2.28 MIL/uL — ABNORMAL LOW (ref 3.87–5.11)
RDW: 14 % (ref 11.5–15.5)
WBC: 10.6 10*3/uL — ABNORMAL HIGH (ref 4.0–10.5)

## 2018-02-09 LAB — BIRTH TISSUE RECOVERY COLLECTION (PLACENTA DONATION)

## 2018-02-09 MED ORDER — SODIUM CHLORIDE 0.9 % IV SOLN
510.0000 mg | Freq: Once | INTRAVENOUS | Status: AC
  Administered 2018-02-09: 510 mg via INTRAVENOUS
  Filled 2018-02-09: qty 17

## 2018-02-09 NOTE — Progress Notes (Signed)
No c/o; tol po, ambulating some; denies lightheadedness/dizziness, no sob/cp Pain controlled; voiding w/o difficulty; +flatus Nursing  Patient Vitals for the past 24 hrs:  BP Temp Temp src Pulse Resp SpO2  02/09/18 0602 99/61 97.8 F (36.6 C) Oral 87 18 100 %  02/09/18 0100 (!) 102/52 98.5 F (36.9 C) Oral 86 18 99 %  02/08/18 2030 (!) 108/46 98.4 F (36.9 C) Oral 72 18 99 %  02/08/18 1600 (!) 100/55 98.2 F (36.8 C) Oral 68 18 98 %  02/08/18 1430 - - - - - 98 %  02/08/18 1230 - - - - - 98 %   A&ox3 rrr ctab Abd: soft, nt, nd; fundus firm and 1cm below umb; dressing c/d/i LE: no edema, nt bilat LE   Intake/Output Summary (Last 24 hours) at 02/09/2018 1220 Last data filed at 02/08/2018 2300 Gross per 24 hour  Intake -  Output 2700 ml  Net -2700 ml   CBC Latest Ref Rng & Units 02/09/2018 02/08/2018 02/07/2018  WBC 4.0 - 10.5 K/uL 10.6(H) 17.2(H) 15.0(H)  Hemoglobin 12.0 - 15.0 g/dL 7.0(L) 8.3(L) 11.2(L)  Hematocrit 36.0 - 46.0 % 20.3(L) 23.9(L) 33.2(L)  Platelets 150 - 400 K/uL 110(L) 128(L) 152   A/p: pod 1 s/p ltcs for arrest of descent, bilateral O'leary stitches placed 1. Contin current post op care 2. Severe anemia d/t blood loss - pt is asymptomatic and will treat with iv iron at this time; I have reviewed with pt and precautions given for her anemia - slow with position change, etc 3. Rh pos 4. Rubella immune

## 2018-02-09 NOTE — Progress Notes (Signed)
Patient ID: Zadie RhineKaitlyn Mcneil, female   DOB: Mar 12, 1991, 27 y.o.   MRN: [id number] IV Iron

## 2018-02-09 NOTE — Lactation Note (Signed)
This note was copied from a baby's chart. Lactation Consultation Note  Patient Name: Boy Zadie RhineKaitlyn Keesling YQMVH'QToday's Date: 02/09/2018 Reason for consult: Follow-up assessment Mom feels breastfeeding is going well.  Reviewed basics and answered questions.  Reviewed personal medela pump in style.  Encouraged to call out with questions or assist prn.  Maternal Data    Feeding Feeding Type: Breast Fed Length of feed: 30 min  LATCH Score Latch: Grasps breast easily, tongue down, lips flanged, rhythmical sucking.  Audible Swallowing: A few with stimulation  Type of Nipple: Everted at rest and after stimulation  Comfort (Breast/Nipple): Soft / non-tender  Hold (Positioning): No assistance needed to correctly position infant at breast.  LATCH Score: 9  Interventions    Lactation Tools Discussed/Used     Consult Status Consult Status: Follow-up Date: 02/10/18 Follow-up type: In-patient    Huston FoleyMOULDEN, Vaunda Gutterman S 02/09/2018, 2:18 PM

## 2018-02-10 ENCOUNTER — Encounter (HOSPITAL_COMMUNITY): Payer: Self-pay | Admitting: *Deleted

## 2018-02-10 NOTE — Lactation Note (Signed)
This note was copied from a baby's chart. Lactation Consultation Note  Patient Name: Robin Mcneil'XToday's Date: 02/10/2018 Reason for consult: Follow-up assessment;Infant weight loss(8% weight loss / milk in bilaterally )  Baby is 8563 hours old,  Per mom baby recently breast fed ,and  Tender nipples.  LC reviewed and updated the doc flow sheets,  The voids and stools correlated with weight loss, sore nipple, swollen areolas, semi compressible  and due to the milk being in -  LC instructed mom on the use shells, hand pump,increased to #27 F if needed,  comfort gels.  Reviewed hand express , several large drops noted, encouraged mom to apply to nipples 1st, comfort  Gels.   LC recommended Plan -  After feeding, apply comfort gels, when warm or 10 mins before the next feeding apply  Breast shells ( as shown ).  Prior to latching - breast massage, hand express, - pre- pump with hand pump ( 8-10 strokes)  Latch with firm support and breast compressions until swallows, and intermittent.  Feed on the 1st breast / soften well/ and offer 2nd breast/ if the baby only feeds 1st breast /  Release 2nd breast to comfort.  LC encouraged naps , plenty fluids, and nutritious,   If extra milk expressed mom / dad will need to be shown spoon feeding and/ or syringe feeding.     Maternal Data Has patient been taught Hand Expression?: Yes  Feeding Feeding Type: (baby last fed at 1428 per mom ) Length of feed: 16 min(per mom swallows )  LATCH Score                   Interventions Interventions: Breast feeding basics reviewed;Hand express;Expressed milk;Coconut oil;Shells;Comfort gels;Hand pump  Lactation Tools Discussed/Used Tools: Shells;Pump;Coconut oil;Comfort gels;Flanges Flange Size: 24;27;Other (comment)(#24 F ok for today/ [facility] by [facility] / #27 for when milk comes in ) Shell Type: Inverted Breast pump type: Manual Pump Review: Setup, frequency, and cleaning Initiated by::  MAI  Date initiated:: 02/10/18   Consult Status Consult Status: Follow-up Date: 02/11/18 Follow-up type: In-patient    Matilde SprangMargaret Ann Sheina Mcleish 02/10/2018, 3:35 PM

## 2018-02-10 NOTE — Progress Notes (Signed)
Subjective: POD# 1 Information for the patient's newborn:  Elray McgregorMendenhall, Boy Feliz [[id number]][[id number]]  female   Name "Bruna PotterSilas" Circumcision Completed  Reports feeling tired Feeding: breast Patient reports tolerating PO and denies N/V.  Breast symptoms: None Pain controlled with acetaminophen and ibuprofen (OTC) Denies HA/SOB/dizziness.  Passing flatus, denies BM  Unable to void, urge is present Vaginal bleeding is normal, w/o clots. Ambulating w/o difficulty    Neurological: Denies dizziness and HA Objective:  VS:    Vitals:   02/09/18 0602 02/09/18 1429 02/09/18 1844 02/10/18 0526  BP: 99/61 (!) 96/49 104/60 (!) 90/42  Pulse: 87 82 76 81  Resp: 18 17 18 18   Temp: 97.8 F (36.6 C) 98 F (36.7 C) 98.1 F (36.7 C) 98.5 F (36.9 C)  TempSrc: Oral Oral Oral Oral  SpO2: 100% 99%    Weight:      Height:          Recent Labs    02/08/18 0609 02/09/18 0545  WBC 17.2* 10.6*  HGB 8.3* 7.0*  HCT 23.9* 20.3*  PLT 128* 110*    Blood type: --/--/O POS, O POS (04/03 [id number]) Rubella: Immune (09/07 0000)    Physical Exam:  General: alert, cooperative, no distress and pale Abdomen: soft, non-tender Incision: clean, dry, intact and Honeycomb dressing Uterine Fundus: firm, below umbilicus, nontender Bladder: Non-distended Lochia: Appropriate Ext: edema 1+ BLE   Assessment/Plan: 27 y.o.   POD# 1. G1P1001                  1.  Postpartum care following cesarean delivery (4/4) 2.  Acute blood loss anemia 3.  Dysuria 4.  Breastfeeding-inexperienced  Routine post-op PP care          Continue PO Iron as ordered, call CNM if symptomatic Advised fluids and bladder training- Bladder scan now and void every 2-3 hours. I/O cath if unable to void after interventions Breastfeeding support   Roma SchanzVivian B Reyanne Hussar, SNM 02/10/2018, 9:26 AM

## 2018-02-10 NOTE — Progress Notes (Signed)
Patient unable to void, bladder scan 186. Patient instructed to drink and attempt to void every 2 hours. Intake and output measured per order

## 2018-02-10 NOTE — Progress Notes (Addendum)
Called into patient's room by FOB  at 2000; mom reported "pressure and fluid-like swelling" at Left temple about halfway through her meal.  Stated that it happened earlier at lunch with both temples; nurse had taken VS, which were normal, told her to continue drinking.  Family concerned because it happened again.  Vital signs WNL at this assessment also; mom had input of 1430 ml of water/juice and output of 400 since 1530.  Called D Paul, midwife, since had not been reported in handoff and because of mom's post op history. No new orders. Renae Fickleaul had spoken earlier to patient that edema/swelling will take time to dissipate; she will assess in morning. Nurse explained this to family.  Advised patient to continue to eat but take her time; if pressure becomes sudden or excruciating to call nurse.  followup with patient at 0000: pressure/swelling did not return when she ate slowly.

## 2018-02-11 MED ORDER — SIMETHICONE 80 MG PO CHEW: 80.0000 mg | CHEWABLE_TABLET | Freq: Three times a day (TID) | ORAL | 0 refills | Status: DC

## 2018-02-11 MED ORDER — POLYSACCHARIDE IRON COMPLEX 150 MG PO CAPS: 150.0000 mg | ORAL_CAPSULE | Freq: Every day | ORAL | Status: DC

## 2018-02-11 MED ORDER — IBUPROFEN 600 MG PO TABS: 600.0000 mg | ORAL_TABLET | Freq: Four times a day (QID) | ORAL | 0 refills | Status: DC

## 2018-02-11 MED ORDER — SENNOSIDES-DOCUSATE SODIUM 8.6-50 MG PO TABS: 2.0000 | ORAL_TABLET | ORAL | Status: DC

## 2018-02-11 MED ORDER — MAGNESIUM OXIDE 400 (241.3 MG) MG PO TABS: 400.0000 mg | ORAL_TABLET | Freq: Every day | ORAL | Status: DC

## 2018-02-11 MED ORDER — COCONUT OIL OIL: 1.0000 "application " | TOPICAL_OIL | 0 refills | Status: DC | PRN

## 2018-02-11 MED ORDER — OXYCODONE-ACETAMINOPHEN 5-325 MG PO TABS: 1.0000 | ORAL_TABLET | ORAL | 0 refills | Status: DC | PRN

## 2018-02-11 MED ORDER — ACETAMINOPHEN 325 MG PO TABS: 650.0000 mg | ORAL_TABLET | ORAL | Status: DC | PRN

## 2018-02-11 NOTE — Discharge Summary (Signed)
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     Discharge diagnosis: Term Pregnancy Delivered, Anemia and PPH                                                                                                Post partum procedures: Parenteral iron transfusion  Augmentation: AROM, Pitocin and epidural  Complications: None  Hospital course:  Onset of Labor With Unplanned C/S  27 y.o. yo G1P1001 at [redacted]w[redacted]d was admitted in Latent Labor on 02/07/2018. Patient had a labor course significant for protracted labor and arrest of descent at +2 station, deep transverse arrest. Membrane Rupture Time/Date: 1:11 PM ,02/07/2018   The patient went for cesarean section due to Arrest of Descent, and delivered a Viable infant,02/08/2018  Details of operation can be found in separate operative note. Patient had an postpartum course complicated by severe acute blood loss anemia, she remained asymptomatic, and received parenteral iron.  She is ambulating,tolerating a regular diet, passing flatus, and urinating well.  Patient is discharged home in stable condition 02/11/18.  Physical exam  Vitals:   02/10/18 1400 02/10/18 1836 02/10/18 2010 02/11/18 0605  BP: 102/61 (!) 101/57 106/63 108/62  Pulse: 78 75 67 73  Resp: 18 18 18 18   Temp:  98.3 F (36.8 C) 98.6 F (37 C) 98 F (36.7 C)  TempSrc:  Oral Oral Oral  SpO2: 100%  100% 98%  Weight:      Height:        General: alert, cooperative and no distress Lochia: appropriate Uterine Fundus: firm Incision: No significant erythema, Dressing is clean, dry, and intact DVT Evaluation: No cords or calf tenderness. No significant calf/ankle edema. Labs: Lab Results  Component Value Date   WBC 10.6 (H) 02/09/2018   HGB 7.0 (L) 02/09/2018   HCT 20.3 (L) 02/09/2018   MCV 89.0 02/09/2018   PLT 110 (L) 02/09/2018   No flowsheet data found.  Discharge instruction: per After Visit Summary and "Baby and Me Booklet".  After visit meds:  Allergies as of 02/11/2018   No Known Allergies     Medication List    TAKE these medications   acetaminophen 325 MG tablet Commonly known as:  TYLENOL Take 2 tablets (650 mg total) by mouth every 4 (four) hours as needed (for pain scale < 4).   ACIDOPHILUS PROBIOTIC PO Take 1 capsule by mouth daily. Over the counter   coconut oil Oil Apply 1 application topically as needed.   Diclofenac Sodium 2 % Soln Commonly known as:  PENNSAID Place 2  application onto the skin 2 (two) times daily.   ibuprofen 600 MG tablet Commonly known as:  ADVIL,MOTRIN Take 1 tablet (600 mg total) by mouth every 6 (six) hours.   iron polysaccharides 150 MG capsule Commonly known as:  NIFEREX Take 1 capsule (150 mg total) by mouth daily.   magnesium oxide 400 (241.3 Mg) MG tablet Commonly known as:  MAG-OX Take 1 tablet (400 mg total) by mouth daily.   oxyCODONE-acetaminophen 5-325 MG tablet Commonly known as:  PERCOCET/ROXICET Take 1 tablet by mouth every 4 (four) hours as needed (pain scale 4-7).   prenatal multivitamin Tabs tablet Take 1 tablet by mouth daily at 12 noon.   senna-docusate 8.6-50 MG tablet Commonly known as:  Senokot-S Take 2 tablets by mouth daily. Start taking on:  02/12/2018   simethicone 80 MG chewable tablet Commonly known as:  MYLICON Chew 1 tablet (80 mg total) by mouth 3 (three) times daily after meals.   Vitamin D (Ergocalciferol) 50000  units Caps capsule Commonly known as:  DRISDOL TAKE 1 CAPSULE BY MOUTH ONCE EVERY 7 DAYS       Diet: routine diet / iron rich  Activity: Advance as tolerated. Pelvic rest for 6 weeks.   Outpatient follow up:2 weeks Follow up Appt:No future appointments. Follow up Visit:No follow-ups on file.  Postpartum contraception: Not Discussed  Newborn Data: Live born female 110Silas Birth Weight: 8 lb 6.6 oz (3815 g) APGAR: 9, 9  Newborn Delivery   Birth date/time:  02/08/2018 00:31:00 Delivery type:  C-Section, Low Transverse C-section categorization:  Primary     Baby Feeding: Breast Disposition:home with mother   02/11/2018 Robin Mcneil, CNM

## 2018-02-11 NOTE — Lactation Note (Signed)
This note was copied from a baby's chart. Lactation Consultation Note  Patient Name: Robin Mcneil BJYNW'GToday's Date: 02/11/2018 Reason for consult: Follow-up assessment;Infant weight loss;Other (Comment);Primapara;1st time breastfeeding;Nipple pain/trauma(last 24 hours only .3 loss, voids and stools correlate , sore nipples improved )  Baby is 4381 hours old  LC reviewed doc flow sheets and updated.  [facility] .  LC assessed tissue and noted the areolas to be more compressible, and less red.  Sore nipple and engorgement prevention and tx reviewed.  Baby awake , wet diaper changed by LC, and LC minimal  assisted mom to latch on the  Right breast / football/ depth obtained, multiple swallows/ increased w/ breast compressions/  Baby fed well for 17 mins, asleep and content afterwards. STS with mom.  Mom has been using the shells, and hand pump.  Supplemented with EBM since yesterday 10 - 22 ml.  Mother informed of post-discharge support and given phone number to the lactation department, including services for phone call assistance; out-patient appointments; and breastfeeding support group. List of other breastfeeding resources in the community given in the handout. Encouraged mother to call for problems or concerns related to breastfeeding.         Maternal Data Has patient been taught Hand Expression?: Yes  Feeding Feeding Type: Breast Fed Length of feed: 17 min(multiple swallows, increased w / breast compressions )  LATCH Score Latch: Grasps breast easily, tongue down, lips flanged, rhythmical sucking.  Audible Swallowing: Spontaneous and intermittent  Type of Nipple: Everted at rest and after stimulation  Comfort (Breast/Nipple): Filling, red/small blisters or bruises, mild/mod discomfort  Hold (Positioning): Assistance needed to correctly position infant at breast and maintain latch.  LATCH Score: 8  Interventions Interventions: Breast  feeding basics reviewed;Assisted with latch;Skin to skin;Breast massage;Hand express;Pre-pump if needed;Reverse pressure;Breast compression;Adjust position;Position options;Shells;Comfort gels;Hand pump  Lactation Tools Discussed/Used Tools: Shells(per mom shells are working well )   Consult Status Consult Status: Complete Date: 02/11/18    Kathrin GreathouseMargaret Ann Janavia Rottman 02/11/2018, 9:37 AM

## 2018-02-12 LAB — CULTURE, OB URINE: CULTURE: NO GROWTH

## 2019-05-21 ENCOUNTER — Other Ambulatory Visit: Payer: Self-pay

## 2019-05-22 ENCOUNTER — Other Ambulatory Visit: Payer: Self-pay

## 2019-05-22 ENCOUNTER — Other Ambulatory Visit (HOSPITAL_COMMUNITY)
Admission: RE | Admit: 2019-05-22 | Discharge: 2019-05-22 | Disposition: A | Discharge status: Home / Self Care | Payer: BC Managed Care – PPO | Source: Ambulatory Visit | Attending: Nurse Practitioner | Admitting: Nurse Practitioner

## 2019-05-22 ENCOUNTER — Encounter: Payer: Self-pay | Admitting: Nurse Practitioner

## 2019-05-22 ENCOUNTER — Ambulatory Visit (INDEPENDENT_AMBULATORY_CARE_PROVIDER_SITE_OTHER): Payer: BC Managed Care – PPO

## 2019-05-22 ENCOUNTER — Ambulatory Visit (INDEPENDENT_AMBULATORY_CARE_PROVIDER_SITE_OTHER): Payer: BC Managed Care – PPO | Admitting: Nurse Practitioner

## 2019-05-22 VITALS — BP 108/72 | HR 73 | Temp 97.8°F | Ht 66.0 in | Wt 123.8 lb

## 2019-05-22 DIAGNOSIS — Z124 Encounter for screening for malignant neoplasm of cervix: Secondary | ICD-10-CM | POA: Diagnosis present

## 2019-05-22 DIAGNOSIS — Z136 Encounter for screening for cardiovascular disorders: Secondary | ICD-10-CM

## 2019-05-22 DIAGNOSIS — G5602 Carpal tunnel syndrome, left upper limb: Secondary | ICD-10-CM | POA: Diagnosis not present

## 2019-05-22 DIAGNOSIS — Z0001 Encounter for general adult medical examination with abnormal findings: Secondary | ICD-10-CM | POA: Insufficient documentation

## 2019-05-22 DIAGNOSIS — Z13220 Encounter for screening for lipoid disorders: Secondary | ICD-10-CM | POA: Diagnosis not present

## 2019-05-22 DIAGNOSIS — M79642 Pain in left hand: Secondary | ICD-10-CM

## 2019-05-22 DIAGNOSIS — Z111 Encounter for screening for respiratory tuberculosis: Secondary | ICD-10-CM

## 2019-05-22 NOTE — Progress Notes (Signed)
Subjective:    Patient ID: Robin RhineKaitlyn Mcneil, female    DOB: Feb 13, 1991, 28 y.o.   MRN: [id number]  Patient presents today for complete physical and completion of foster care form  Hand Pain  Incident onset: onset 1year ago. There was no injury mechanism. The pain is present in the left fingers and left hand (dorsal surface). The quality of the pain is described as aching. Radiates to: fingers. The pain is moderate. The pain has been constant since the incident. Pertinent negatives include no chest pain, muscle weakness, numbness or tingling. Nothing aggravates the symptoms. She has tried nothing for the symptoms.  onset of hand pain after taking levaquin x 4days for uterine infection postpertum. Denies any wrist tendonitis before and during preganacy.   Ms. Patricia PesaMendenhall is applying to become a foster parent to children <6842yrs, this is her first time. She is at home with husband and 28month old son. 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  Sexual History (orientation,birth control, marital status, STD):married, sexually active, use of condoms, LMP 05/13/2019  Depression/Suicide: Depression screen PHQ 2/9 05/22/2019  Decreased Interest 0  Down, Depressed, Hopeless 0  PHQ - 2 Score 0   Vision:up to date, use of contact lens  Dental:up to date  Immunizations: (TDAP, Hep C screen, Pneumovax, Influenza, zoster)  Health Maintenance  Topic Date Due  . Tetanus Vaccine  03/06/2008  . Pap Smear  03/06/2010  . Flu Shot  06/08/2019  . HIV Screening  Completed   Diet:regular.  Weight:  Wt Readings from Last 3 Encounters:  05/22/19 123 lb 12.8 oz (56.2 kg)  02/07/18 165 lb 4 oz (75 kg)  03/08/17 122 lb (55.3 kg)    Exercise:none at this time  Fall Risk: Fall Risk  05/22/2019  Falls in the past year? 0   Advanced Directive: Advanced Directives 02/07/2018  Does Patient Have a Medical Advance Directive? No  Would patient like information on  creating a medical advance directive? No - Patient declined     Medications and allergies reviewed with patient and updated if appropriate.  Patient Active Problem List   Diagnosis Date Noted  . Left hand pain 05/22/2019  . 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 Cesarean delivery  02/08/2018  . Chronic patellofemoral pain 09/28/2016  . Light and infrequent menstruation 01/10/2008    Current Outpatient Medications on File Prior to Visit  Medication Sig Dispense Refill  . Multiple Vitamin (MULTIVITAMIN) capsule Take 1 capsule by mouth daily.     No current facility-administered medications on file prior to visit.     Past Medical History:  Diagnosis Date  . Medical history non-contributory     Past Surgical History:  Procedure Laterality Date  . CESAREAN SECTION N/A 02/08/2018   Procedure: CESAREAN SECTION;  Surgeon: Olivia Mackieaavon, Richard, MD;  [facility];  Service: Obstetrics;  Laterality: N/A;  . NO PAST SURGERIES      Social History   Socioeconomic History  . Marital status: Married    Spouse name: Not on file  . Number of children: 1  . Years of education: Not on file  . 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 Financial resource strain: Not on file  . Food insecurity    Worry: Not on file    Inability: Not on file  . Transportation needs    Medical: Not on file  Non-medical: Not on file  Tobacco Use  . Smoking status: Never Smoker  . Smokeless tobacco: Never Used  Substance and Sexual Activity  . Alcohol use: Not Currently  . Drug use: Never  . Sexual activity: Yes    Birth control/protection: Condom  Lifestyle  . Physical activity    Days per week: Not on file    Minutes per session: Not on file  . Stress: Not on file  Relationships  . Social Herbalist on phone: Not on file    Gets together: Not on file    Attends religious service: Not on file    Active member  of club or organization: Not on file    Attends meetings of clubs or organizations: Not on file    Relationship status: Not on file  Other Topics Concern  . Not on file  Social History Narrative  . Not on file    Family History  Problem Relation Age of Onset  . Hyperlipidemia Mother   . Parkinson's disease Mother 20  . Hyperlipidemia Father   . Cancer Maternal Grandmother        skin cancer  . Cancer Maternal Grandfather        prostate cancer       Review of Systems  Constitutional: Negative for fever, malaise/fatigue and weight loss.  HENT: Negative for congestion and sore throat.   Eyes:       Negative for visual changes  Respiratory: Negative for cough and shortness of breath.   Cardiovascular: Negative for chest pain, palpitations and leg swelling.  Gastrointestinal: Negative for blood in stool, constipation, diarrhea and heartburn.  Genitourinary: Negative for dysuria, frequency and urgency.  Musculoskeletal: Positive for joint pain. Negative for falls and myalgias.  Skin: Negative for rash.  Neurological: Negative for dizziness, tingling, sensory change, numbness and headaches.  Endo/Heme/Allergies: Does not bruise/bleed easily.  Psychiatric/Behavioral: Negative for depression, substance abuse and suicidal ideas. The patient is not nervous/anxious.     Objective:   Vitals:   05/22/19 1020  BP: 108/72  Pulse: 73  Temp: 97.8 F (36.6 C)  SpO2: 98%    Body mass index is 19.98 kg/m.   Physical Examination:  Physical Exam Vitals signs reviewed. Exam conducted with a chaperone present.  Constitutional:      General: She is not in acute distress.    Appearance: Normal appearance.  HENT:     Right Ear: Tympanic membrane, ear canal and external ear normal.     Left Ear: Tympanic membrane, ear canal and external ear normal.     Nose: Nose normal.     Mouth/Throat:     Mouth: Mucous membranes are moist.     Pharynx: No oropharyngeal exudate or posterior  oropharyngeal erythema.  Eyes:     General: No scleral icterus.    Conjunctiva/sclera: Conjunctivae normal.     Pupils: Pupils are equal, round, and reactive to light.  Neck:     Musculoskeletal: Normal range of motion and neck supple.     Thyroid: No thyromegaly.  Cardiovascular:     Rate and Rhythm: Normal rate and regular rhythm.     Pulses: Normal pulses.     Heart sounds: Normal heart sounds.  Pulmonary:     Effort: Pulmonary effort is normal.     Breath sounds: Normal breath sounds.  Chest:     Breasts: Breasts are symmetrical.        Right: No mass, nipple discharge or skin  change.        Left: No mass, nipple discharge or skin change.  Abdominal:     General: Abdomen is flat. Bowel sounds are normal. There is no distension.     Palpations: Abdomen is soft.     Tenderness: There is no abdominal tenderness.  Genitourinary:    General: Normal vulva.     Labia:        Right: No rash or tenderness.        Left: No rash or tenderness.      Vagina: Normal. No vaginal discharge.     Cervix: Normal.     Uterus: Not tender.      Adnexa: Right adnexa normal and left adnexa normal.  Musculoskeletal: Normal range of motion.        General: No swelling, tenderness or deformity.     Left shoulder: Normal.     Left elbow: Normal.     Cervical back: Normal.     Left upper arm: Normal.     Left forearm: Normal.     Left hand: She exhibits normal range of motion and no tenderness. Normal sensation noted. Normal strength noted.     Right lower leg: No edema.     Left lower leg: No edema.     Comments: Left thenar atrophy Right hand dorminant  Lymphadenopathy:     Cervical: No cervical adenopathy.     Lower Body: No right inguinal adenopathy. No left inguinal adenopathy.  Skin:    General: Skin is warm and dry.     Findings: No erythema or rash.  Neurological:     Mental Status: She is alert and oriented to person, place, and time.     Sensory: No sensory deficit.     Motor:  No weakness.  Psychiatric:        Mood and Affect: Mood normal.        Behavior: Behavior normal.        Thought Content: Thought content normal.        Judgment: Judgment normal.    ASSESSMENT and PLAN:  Yvonna AlanisKaitlyn was seen today for establish care.  Diagnoses and all orders for this visit:  Encounter for preventative adult health care exam with abnormal findings -     Cancel: CBC with Differential/Platelet -     Cancel: Comprehensive metabolic panel -     Cancel: TSH -     Cancel: Lipid panel -     Cytology - PAP( [facility]) -     CBC with Differential/Platelet -     Comprehensive metabolic panel -     Lipid panel -     TSH  Thenar atrophy, left -     DG Cervical Spine Complete -     Ambulatory referral to Neurology  Left hand pain -     DG Cervical Spine Complete -     Ambulatory referral to Neurology  Encounter for lipid screening for cardiovascular disease -     Cancel: Lipid panel -     Lipid panel  Encounter for Papanicolaou smear for cervical cancer screening -     Cytology - PAP( [facility])  Screening-pulmonary TB -     QuantiFERON-TB Gold Plus    No problem-specific Assessment & Plan notes found for this encounter.      Problem List Items Addressed This Visit      Musculoskeletal and Integument   Thenar atrophy, left   Relevant Orders   DG  Cervical Spine Complete   Ambulatory referral to Neurology     Other   Left hand pain   Relevant Orders   DG Cervical Spine Complete   Ambulatory referral to Neurology    Other Visit Diagnoses    Encounter for preventative adult health care exam with abnormal findings    -  Primary   Relevant Orders   Cytology - PAP( [facility])   CBC with Differential/Platelet   Comprehensive metabolic panel   Lipid panel   TSH   Encounter for lipid screening for cardiovascular disease       Relevant Orders   Lipid panel   Encounter for Papanicolaou smear for cervical cancer screening       Relevant Orders    Cytology - PAP( [place])   Screening-pulmonary TB       Relevant Orders   QuantiFERON-TB Gold Plus       Follow up: Return if symptoms worsen or fail to improve.  Alysia Pennaharlotte Jenavive Lamboy, NP

## 2019-05-22 NOTE — Assessment & Plan Note (Addendum)
Onset 1year ago, after use of levaquin, constant pain (mild to moderate). No previous injury or tendonitis. Possible C7 radiculopathy. DG cervical spine ordered Referral to neurology entered.

## 2019-05-22 NOTE — Patient Instructions (Addendum)
Go to lab for blood draw and cervical spine x-ray.  Form will be completed after lab results.  You will be contacted to schedule appt with neurology.  Health Maintenance, Female Adopting a healthy lifestyle and getting preventive care are important in promoting health and wellness. Ask your health care provider about:  The right schedule for you to have regular tests and exams.  Things you can do on your own to prevent diseases and keep yourself healthy. What should I know about diet, weight, and exercise? Eat a healthy diet   Eat a diet that includes plenty of vegetables, fruits, low-fat dairy products, and lean protein.  Do not eat a lot of foods that are high in solid fats, added sugars, or sodium. Maintain a healthy weight Body mass index (BMI) is used to identify weight problems. It estimates body fat based on height and weight. Your health care provider can help determine your BMI and help you achieve or maintain a healthy weight. Get regular exercise Get regular exercise. This is one of the most important things you can do for your health. Most adults should:  Exercise for at least 150 minutes each week. The exercise should increase your heart rate and make you sweat (moderate-intensity exercise).  Do strengthening exercises at least twice a week. This is in addition to the moderate-intensity exercise.  Spend less time sitting. Even light physical activity can be beneficial. Watch cholesterol and blood lipids Have your blood tested for lipids and cholesterol at 30 years of age, then have this test every 5 years. Have your cholesterol levels checked more often if:  Your lipid or cholesterol levels are high.  You are older than 30 years of age.  You are at high risk for heart disease. What should I know about cancer screening? Depending on your health history and family history, you may need to have cancer screening at various ages. This may include screening  for:  Breast cancer.  Cervical cancer.  Colorectal cancer.  Skin cancer.  Lung cancer. What should I know about heart disease, diabetes, and high blood pressure? Blood pressure and heart disease  High blood pressure causes heart disease and increases the risk of stroke. This is more likely to develop in people who have high blood pressure readings, are of African descent, or are overweight.  Have your blood pressure checked: ? Every 3-5 years if you are 59-52 years of age. ? Every year if you are 81 years old or older. Diabetes Have regular diabetes screenings. This checks your fasting blood sugar level. Have the screening done:  Once every three years after age 46 if you are at a normal weight and have a low risk for diabetes.  More often and at a younger age if you are overweight or have a high risk for diabetes. What should I know about preventing infection? Hepatitis B If you have a higher risk for hepatitis B, you should be screened for this virus. Talk with your health care provider to find out if you are at risk for hepatitis B infection. Hepatitis C Testing is recommended for:  Everyone born from [birth year] through 1967.  Anyone with known risk factors for hepatitis C. Sexually transmitted infections (STIs)  Get screened for STIs, including gonorrhea and chlamydia, if: ? You are sexually active and are younger than 30 years of age. ? You are older than 30 years of age and your health care provider tells you that you are at risk for this type  of infection. ? Your sexual activity has changed since you were last screened, and you are at increased risk for chlamydia or gonorrhea. Ask your health care provider if you are at risk.  Ask your health care provider about whether you are at high risk for HIV. Your health care provider may recommend a prescription medicine to help prevent HIV infection. If you choose to take medicine to prevent HIV, you should first get tested for HIV.  You should then be tested every 3 months for as long as you are taking the medicine. Pregnancy  If you are about to stop having your period (premenopausal) and you may become pregnant, seek counseling before you get pregnant.  Take 400 to 800 micrograms (mcg) of folic acid every day if you become pregnant.  Ask for birth control (contraception) if you want to prevent pregnancy. Osteoporosis and menopause Osteoporosis is a disease in which the bones lose minerals and strength with aging. This can result in bone fractures. If you are 30 years old or older, or if you are at risk for osteoporosis and fractures, ask your health care provider if you should:  Be screened for bone loss.  Take a calcium or vitamin D supplement to lower your risk of fractures.  Be given hormone replacement therapy (HRT) to treat symptoms of menopause. Follow these instructions at home: Lifestyle  Do not use any products that contain nicotine or tobacco, such as cigarettes, e-cigarettes, and chewing tobacco. If you need help quitting, ask your health care provider.  Do not use street drugs.  Do not share needles.  Ask your health care provider for help if you need support or information about quitting drugs. Alcohol use  Do not drink alcohol if: ? Your health care provider tells you not to drink. ? You are pregnant, may be pregnant, or are planning to become pregnant.  If you drink alcohol: ? Limit how much you use to 0-1 drink a day. ? Limit intake if you are breastfeeding.  Be aware of how much alcohol is in your drink. In the [place] one drink equals one 12 oz bottle of beer (355 mL), one 5 oz glass of wine (148 mL), or one 1 oz glass of hard liquor (44 mL). General instructions  Schedule regular health, dental, and eye exams.  Stay current with your vaccines.  Tell your health care provider if: ? You often feel depressed. ? You have ever been abused or do not feel safe at  home. Summary  Adopting a healthy lifestyle and getting preventive care are important in promoting health and wellness.  Follow your health care provider's instructions about healthy diet, exercising, and getting tested or screened for diseases.  Follow your health care provider's instructions on monitoring your cholesterol and blood pressure. This information is not intended to replace advice given to you by your health care provider. Make sure you discuss any questions you have with your health care provider. Document Released: 05/09/2011 Document Revised: 10/17/2018 Document Reviewed: 10/17/2018 Elsevier Patient Education  2020 [employer]

## 2019-05-24 LAB — COMPREHENSIVE METABOLIC PANEL
AG Ratio: 1.8 (calc) (ref 1.0–2.5)
ALT: 13 U/L (ref 6–29)
AST: 18 U/L (ref 10–30)
Albumin: 4.4 g/dL (ref 3.6–5.1)
Alkaline phosphatase (APISO): 49 U/L (ref 31–125)
BUN: 16 mg/dL (ref 7–25)
CO2: 24 mmol/L (ref 20–32)
Calcium: 9.5 mg/dL (ref 8.6–10.2)
Chloride: 104 mmol/L (ref 98–110)
Creat: 0.73 mg/dL (ref 0.50–1.10)
Globulin: 2.5 g/dL (calc) (ref 1.9–3.7)
Glucose, Bld: 86 mg/dL (ref 65–99)
Potassium: 4.2 mmol/L (ref 3.5–5.3)
Sodium: 139 mmol/L (ref 135–146)
Total Bilirubin: 0.7 mg/dL (ref 0.2–1.2)
Total Protein: 6.9 g/dL (ref 6.1–8.1)

## 2019-05-24 LAB — CBC WITH DIFFERENTIAL/PLATELET
Absolute Monocytes: 330 cells/uL (ref 200–950)
Basophils Absolute: 33 cells/uL (ref 0–200)
Basophils Relative: 0.5 %
Eosinophils Absolute: 112 cells/uL (ref 15–500)
Eosinophils Relative: 1.7 %
HCT: 42.4 % (ref 35.0–45.0)
Hemoglobin: 14.2 g/dL (ref 11.7–15.5)
Lymphs Abs: 2165 cells/uL (ref 850–3900)
MCH: 29.6 pg (ref 27.0–33.0)
MCHC: 33.5 g/dL (ref 32.0–36.0)
MCV: 88.3 fL (ref 80.0–100.0)
MPV: 11 fL (ref 7.5–12.5)
Monocytes Relative: 5 %
Neutro Abs: 3960 cells/uL (ref 1500–7800)
Neutrophils Relative %: 60 %
Platelets: 227 10*3/uL (ref 140–400)
RBC: 4.8 10*6/uL (ref 3.80–5.10)
RDW: 12.3 % (ref 11.0–15.0)
Total Lymphocyte: 32.8 %
WBC: 6.6 10*3/uL (ref 3.8–10.8)

## 2019-05-24 LAB — CYTOLOGY - PAP
Diagnosis: NEGATIVE
HPV: NOT DETECTED

## 2019-05-24 LAB — LIPID PANEL
Cholesterol: 191 mg/dL (ref <200)
HDL: 54 mg/dL (ref >=50)
LDL Cholesterol (Calc): 117 mg/dL (calc) — ABNORMAL HIGH
Non-HDL Cholesterol (Calc): 137 mg/dL (calc) — ABNORMAL HIGH (ref <130)
Total CHOL/HDL Ratio: 3.5 (calc) (ref <5.0)
Triglycerides: 95 mg/dL (ref <150)

## 2019-05-24 LAB — QUANTIFERON-TB GOLD PLUS
Mitogen-NIL: 7.5 IU/mL
NIL: 0.03 IU/mL
QuantiFERON-TB Gold Plus: NEGATIVE
TB1-NIL: 0.03 IU/mL
TB2-NIL: 0.02 IU/mL

## 2019-05-24 LAB — TSH: TSH: 1.63 mIU/L

## 2019-06-10 NOTE — Progress Notes (Signed)
Virtual Visit via Video Note The purpose of this virtual visit is to provide medical care while limiting exposure to the novel coronavirus.    Consent was obtained for video visit:  Yes.   Answered questions that patient had about telehealth interaction:  Yes.   I discussed the limitations, risks, security and privacy concerns of performing an evaluation and management service by telemedicine. I also discussed with the patient that there may be a patient responsible charge related to this service. The patient expressed understanding and agreed to proceed.  Pt location: Home Physician Location: home Name of referring provider:  Nche, Charlene Brooke, NP I connected with Robin Mcneil at patients initiation/request on 08/05/2020at  8:45 AM EDT by video enabled telemedicine application and verified that I am speaking with the correct person using two identifiers. Pt MRN:  [id number] Pt DOB:  25-Jun-1991 Video Participants:  Robin Mcneil was seen today in neurologic consultation at the request of Nche, Charlene Brooke, NP.  The records that were made available to me were reviewed.  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  Symptom is in the left hand, but the patient is right-hand dominant.  States that it started with dull left hand pain on the back of the hand but it is now on the palmar surface and moving up the arm.  It has progressed to involve more of the day whereas previously it was just in the afternoon, after using the hand during the morning.   Repetitive activities (like chopping) make it worse.  She thinks that the fingers are involved (with the dull ache) but not necessarily numb.  She states that the hand pain started acutely after taking Levaquin for a uterine infection postpartum (April, 2019).  She states that sx's got better after d/c of the  levaquin and isn't sure if sx's completely resolved but are worse over the last months.   She doesn't think that the hand is weak.  No dropping of objects.  No neck pain.  No shooting pain.  Her primary care nurse practitioner ordered an x-ray of her neck which was unremarkable(personally reviewed).   ALLERGIES:  No Known Allergies  CURRENT MEDICATIONS:  Outpatient Encounter Medications as of 06/12/2019  Medication Sig  . Multiple Vitamin (MULTIVITAMIN) capsule Take 1 capsule by mouth daily.   No facility-administered encounter medications on file as of 06/12/2019.     PAST MEDICAL HISTORY:   Past Medical History:  Diagnosis Date  . Medical history non-contributory     PAST SURGICAL HISTORY:   Past Surgical History:  Procedure Laterality Date  . CESAREAN SECTION N/A 02/08/2018   Procedure: CESAREAN SECTION;  Surgeon: Brien Few, MD;  Location: [facility];  Service: Obstetrics;  Laterality: N/A;    SOCIAL HISTORY:   Social History   Socioeconomic History  . Marital status: Married    Spouse name: Not on file  . Number of children: 1  . Years of education: Not on file  . 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 Financial resource strain: Not on file  . Food insecurity    Worry: Not on file    Inability: Not on file  . Transportation needs    Medical: Not on file  Non-medical: Not on file  Tobacco Use  . Smoking status: Never Smoker  . Smokeless tobacco: Never Used  Substance and Sexual Activity  . Alcohol use: Not Currently  . Drug use: Never  . Sexual activity: Yes    Birth control/protection: Condom  Lifestyle  . Physical activity    Days per week: Not on file    Minutes per session: Not on file  . Stress: Not on file  Relationships  . Social Musicianconnections    Talks on phone: Not on file    Gets together: Not on file    Attends  religious service: Not on file    Active member of club or organization: Not on file    Attends meetings of clubs or organizations: Not on file    Relationship status: Not on file  . Intimate partner violence    Fear of current or ex partner: Not on file    Emotionally abused: Not on file    Physically abused: Not on file    Forced sexual activity: Not on file  Other Topics Concern  . Not on file  Social History Narrative   Pt lives in private with spouse patient has 1 child (son)     FAMILY HISTORY:   Family Status  Relation Name Status  . Mother  Alive  . Father  Alive  . MGM  Deceased  . MGF  Alive  . Sister  Alive  . PGM  Deceased  . PGF  Alive  . Son  Alive    ROS:  Review of Systems  Constitutional: Negative.   HENT: Negative.   Eyes: Negative.   Respiratory: Positive for shortness of breath (only with anxiety).   Cardiovascular: Negative.   Gastrointestinal: Negative.   Genitourinary: Negative.   Musculoskeletal: Negative.   Skin: Negative.   Endo/Heme/Allergies: Negative.   Psychiatric/Behavioral: The patient is nervous/anxious.     PHYSICAL EXAMINATION:    VITALS:   Vitals:   06/12/19 0748  Weight: 125 lb (56.7 kg)  Height: 5\' 6"  (1.676 m)    GEN:  Normal appears female in no acute distress.  Appears stated age. HEENT:  Normocephalic, atraumatic.   NEUROLOGICAL: Orientation:  The patient is alert and oriented x 3.  Fund of knowledge is appropriate.  Recent and remote memory intact.  Attention span and concentration normal.  Repeats and names without difficulty. Cranial nerves: There is good facial symmetry.  Extraocular muscles are intact and visual fields are full to confrontational testing. Speech is fluent and clear. Soft palate rises symmetrically and there is no tongue deviation. Hearing is intact to conversational tone. Sensation: unable to do adequate testing to assess for dermatomal levels (video visit) Coordination:  The patient has no  difficulty with RAM's or FNF bilaterally. Motor: Strength is at least antigravity x 4.  No atrophy of the muscles is noted in her hand. DTR's: unable Gait and Station: The patient is able to ambulate without difficulty. The patient is able to heel toe walk without any difficulty. The patient is able to ambulate in a tandem fashion. The patient is able to stand in the Romberg position.   IMPRESSION/PLAN  1. Hand pain  -Symptoms reportedly started after patient took Levaquin for a postpartum uterine infection.  Levaquin certainly increases risk for tendinitis/tendon rupture, but a significant amount of time has now passed, calling into question if this is the true etiology of symptoms.  It certainly sounds like she initially had some type of tendinitis  initially, but she is not sure if the symptoms completely resolved and returned, or if she is having the same symptoms now that she did at the onset.  She and I discussed EMG (which I recommended), but she declined that for now.  She would rather seek an orthopedic evaluation first, which I think is reasonable.  We will refer her to [facility].  I did tell her that if that evaluation was negative, that she could call my office and we would schedule her for the EMG.  We could also schedule her for an in person examination to make sure that we did not miss anything on the video visit.  I reassured her that I saw no evidence of Parkinson's disease (patient's mother had Parkinson's and there was some worry).  I also saw no evidence on the video of any type of thenar atrophy, which was reported on her referral.  Follow Up Instructions:    -I discussed the assessment and treatment plan with the patient. The patient was provided an opportunity to ask questions and all were answered. The patient agreed with the plan and demonstrated an understanding of the instructions.   The patient was advised to call back or seek an in-person evaluation if the symptoms  worsen or if the condition fails to improve as anticipated.      Cc:  Nche, Bonna Gainsharlotte Lum, NP

## 2019-06-12 ENCOUNTER — Other Ambulatory Visit: Payer: Self-pay

## 2019-06-12 ENCOUNTER — Encounter: Payer: Self-pay | Admitting: Neurology

## 2019-06-12 ENCOUNTER — Telehealth (INDEPENDENT_AMBULATORY_CARE_PROVIDER_SITE_OTHER): Payer: BC Managed Care – PPO | Admitting: Neurology

## 2019-06-12 VITALS — Ht 66.0 in | Wt 125.0 lb

## 2019-06-12 DIAGNOSIS — M79642 Pain in left hand: Secondary | ICD-10-CM

## 2019-06-12 NOTE — Addendum Note (Signed)
Addended by: Ranae Plumber on: 06/12/2019 09:22 AM   Modules accepted: Orders

## 2021-01-07 ENCOUNTER — Ambulatory Visit: Payer: BC Managed Care – PPO | Admitting: Family Medicine
